# Patient Record
Sex: Female | Born: 1995 | Race: Black or African American | Hispanic: No | Marital: Single
Health system: Southern US, Community
[De-identification: ages and names within clinical notes are randomized; demographics above are authoritative.]

## PROBLEM LIST (undated history)

## (undated) DIAGNOSIS — J45909 Unspecified asthma, uncomplicated: Secondary | ICD-10-CM

---

## 2016-10-27 ENCOUNTER — Emergency Department
Admission: EM | Admit: 2016-10-27 | Discharge: 2016-10-27 | Disposition: A | Discharge status: Home / Self Care | Payer: Managed Care, Other (non HMO) | Attending: Emergency Medicine | Admitting: Emergency Medicine

## 2016-10-27 ENCOUNTER — Emergency Department: Payer: Managed Care, Other (non HMO)

## 2016-10-27 DIAGNOSIS — R1031 Right lower quadrant pain: Secondary | ICD-10-CM | POA: Insufficient documentation

## 2016-10-27 DIAGNOSIS — R109 Unspecified abdominal pain: Secondary | ICD-10-CM

## 2016-10-27 DIAGNOSIS — R112 Nausea with vomiting, unspecified: Secondary | ICD-10-CM | POA: Insufficient documentation

## 2016-10-27 DIAGNOSIS — R102 Pelvic and perineal pain: Secondary | ICD-10-CM

## 2016-10-27 DIAGNOSIS — R197 Diarrhea, unspecified: Secondary | ICD-10-CM | POA: Insufficient documentation

## 2016-10-27 HISTORY — DX: Unspecified asthma, uncomplicated: J45.909

## 2016-10-27 LAB — CBC WITH DIFFERENTIAL/PLATELET
Basophils Absolute: 0 10*3/uL (ref 0–0.1)
Basophils Relative: 0 %
EOS ABS: 0 10*3/uL (ref 0–0.7)
Eosinophils Relative: 0 %
HEMATOCRIT: 41.8 % (ref 35.0–47.0)
HEMOGLOBIN: 14.3 g/dL (ref 12.0–16.0)
LYMPHS ABS: 1.5 10*3/uL (ref 1.0–3.6)
Lymphocytes Relative: 9 %
MCH: 30 pg (ref 26.0–34.0)
MCHC: 34.3 g/dL (ref 32.0–36.0)
MCV: 87.6 fL (ref 80.0–100.0)
MONO ABS: 0.8 10*3/uL (ref 0.2–0.9)
MONOS PCT: 5 %
NEUTROS ABS: 14 10*3/uL — AB (ref 1.4–6.5)
NEUTROS PCT: 86 %
Platelets: 343 10*3/uL (ref 150–440)
RBC: 4.77 MIL/uL (ref 3.80–5.20)
RDW: 13.6 % (ref 11.5–14.5)
WBC: 16.4 10*3/uL — ABNORMAL HIGH (ref 3.6–11.0)

## 2016-10-27 LAB — COMPREHENSIVE METABOLIC PANEL
ALBUMIN: 4.8 g/dL (ref 3.5–5.0)
ALK PHOS: 49 U/L (ref 38–126)
ALT: 16 U/L (ref 14–54)
AST: 21 U/L (ref 15–41)
Anion gap: 12 (ref 5–15)
BUN: 10 mg/dL (ref 6–20)
CHLORIDE: 105 mmol/L (ref 101–111)
CO2: 23 mmol/L (ref 22–32)
Calcium: 9.5 mg/dL (ref 8.9–10.3)
Creatinine, Ser: 0.64 mg/dL (ref 0.44–1.00)
GFR calc non Af Amer: >60 mL/min (ref >60)
GLUCOSE: 92 mg/dL (ref 65–99)
Potassium: 3.7 mmol/L (ref 3.5–5.1)
SODIUM: 140 mmol/L (ref 135–145)
Total Bilirubin: 0.6 mg/dL (ref 0.3–1.2)
Total Protein: 8.3 g/dL — ABNORMAL HIGH (ref 6.5–8.1)

## 2016-10-27 LAB — URINALYSIS, COMPLETE (UACMP) WITH MICROSCOPIC
BACTERIA UA: NONE SEEN
Bilirubin Urine: NEGATIVE
Glucose, UA: NEGATIVE mg/dL
Ketones, ur: 80 mg/dL — AB
Leukocytes, UA: NEGATIVE
Nitrite: NEGATIVE
Protein, ur: 30 mg/dL — AB
Specific Gravity, Urine: 1.025 (ref 1.005–1.030)
pH: 5 (ref 5.0–8.0)

## 2016-10-27 LAB — LIPASE, BLOOD: LIPASE: 18 U/L (ref 11–51)

## 2016-10-27 LAB — WET PREP, GENITAL
Clue Cells Wet Prep HPF POC: NONE SEEN
Sperm: NONE SEEN
Trich, Wet Prep: NONE SEEN
Yeast Wet Prep HPF POC: NONE SEEN

## 2016-10-27 LAB — CHLAMYDIA/NGC RT PCR (ARMC ONLY)
CHLAMYDIA TR: DETECTED — AB
N gonorrhoeae: NOT DETECTED

## 2016-10-27 LAB — POCT PREGNANCY, URINE: PREG TEST UR: NEGATIVE

## 2016-10-27 MED ORDER — MORPHINE SULFATE (PF) 4 MG/ML IV SOLN
4.0000 mg | Freq: Once | INTRAVENOUS | Status: AC
Start: 2016-10-27 — End: 2016-10-27
  Administered 2016-10-27: 4 mg via INTRAVENOUS
  Filled 2016-10-27: qty 1

## 2016-10-27 MED ORDER — IOPAMIDOL (ISOVUE-300) INJECTION 61%
30.0000 mL | Freq: Once | INTRAVENOUS | Status: AC
  Administered 2016-10-27: 30 mL via ORAL

## 2016-10-27 MED ORDER — ONDANSETRON HCL 4 MG/2ML IJ SOLN
4.0000 mg | Freq: Once | INTRAMUSCULAR | Status: AC
  Administered 2016-10-27: 4 mg via INTRAVENOUS
  Filled 2016-10-27: qty 2

## 2016-10-27 MED ORDER — IOPAMIDOL (ISOVUE-300) INJECTION 61%
75.0000 mL | Freq: Once | INTRAVENOUS | Status: AC | PRN
  Administered 2016-10-27: 75 mL via INTRAVENOUS

## 2016-10-27 MED ORDER — SODIUM CHLORIDE 0.9 % IV BOLUS (SEPSIS)
1000.0000 mL | Freq: Once | INTRAVENOUS | Status: AC
  Administered 2016-10-27: 1000 mL via INTRAVENOUS

## 2016-10-27 MED ORDER — ONDANSETRON HCL 4 MG PO TABS: 4.0000 mg | ORAL_TABLET | Freq: Three times a day (TID) | ORAL | 0 refills | Status: AC | PRN

## 2016-10-27 NOTE — ED Provider Notes (Addendum)
[facility] Provider [facility]  ____________________________________________   I have reviewed the triage vital signs and the nursing notes.   HISTORY  Chief Complaint Abdominal Pain and Emesis    HPI Caitlin Mitchell is a 21 y.o. female Who is healthy, recently from [place], no prior abdominal surgeries, denies sexual activity, not pertinent perour findings here, presents today with nausea and vomiting for one week with diarrhea as well, watery stool, no melena or bright red blood per rectum. Did have trace of blood in her vomit at one point. Patient is also complaining of focal right lower quadrant abdominal pain. It's been there for a few days. Gradually got worse. Low-grade fever. No history of appendicitis or surgery in the past. No history of ovarian cysts. Denies sexual activity or vaginal discharge. pain is focal and sharp, gradual in onset, associated vomiting and diarrhea nothing makes it better nothing makes it worse. No radiation.   No past medical history on file.  There are no active problems to display for this patient.   No past surgical history on file.  Prior to Admission medications   Not on File    Allergies Patient has no known allergies.  No family history on file.  Social History Social History  Substance Use Topics  . Smoking status: Not on file  . Smokeless tobacco: Not on file  . Alcohol use Not on file    Review of Systems Constitutional: Subjective low-grade fever/ Eyes: No visual changes. ENT: No sore throat. No stiff neck no neck pain Cardiovascular: Denies chest pain. Respiratory: Denies shortness of breath. Gastrointestinal:   see history of present illness Genitourinary: Negative for dysuria. Musculoskeletal: Negative lower extremity swelling Skin: Negative for rash. Neurological: Negative for severe headaches, focal weakness or  numbness.   ____________________________________________   PHYSICAL EXAM:  VITAL SIGNS: ED Triage Vitals [10/27/16 0907]  Enc Vitals Group     BP 131/83     Pulse Rate 77     Resp 14     Temp 98.5 F (36.9 C)     Temp Source Oral     SpO2 96 %     Weight 85 lb (38.6 kg)     Height  (1.575 m)     Head Circumference      Peak Flow      Pain Score 4     Pain Loc      Pain Edu?      Excl. in GC?     Constitutional: Alert and oriented. Well appearing and in no acute distress. Eyes: Conjunctivae are normal Head: Atraumatic HEENT: No congestion/rhinnorhea. Mucous membranes are moist.  Oropharynx non-erythematous Neck:   Nontender with no meningismus, no masses, no stridor Cardiovascular: Normal rate, regular rhythm. Grossly normal heart sounds.  Good peripheral circulation. Respiratory: Normal respiratory effort.  No retractions. Lungs CTAB. Abdominal: Soft and positive tenderness to palpation in the right lower quadrant. No distention. voluntaryguarding no reboundnegative Rovsing sign Back:  There is no focal tenderness or step off.  there is no midline tenderness there are no lesions noted. there is no CVA tenderness Musculoskeletal: No lower extremity tenderness, no upper extremity tenderness. No joint effusions, no DVT signs strong distal pulses no edema Neurologic:  Normal speech and language. No gross focal neurologic deficits are appreciated.  Skin:  Skin is warm, dry and intact. No rash noted. Psychiatric: Mood and affect are normal. Speech and behavior are normal.  ____________________________________________   LABS (  all labs ordered are listed, but only abnormal results are displayed)  Labs Reviewed  COMPREHENSIVE METABOLIC PANEL  CBC WITH DIFFERENTIAL/PLATELET  URINALYSIS, COMPLETE (UACMP) WITH MICROSCOPIC  LIPASE, BLOOD  POC URINE PREG, ED  POCT PREGNANCY, URINE    Pertinent labs  results that were available during my care of the patient were reviewed  by me and considered in my medical decision making (see chart for details). ____________________________________________  EKG  I personally interpreted any EKGs ordered by me or triage  ____________________________________________  RADIOLOGY  Pertinent labs & imaging results that were available during my care of the patient were reviewed by me and considered in my medical decision making (see chart for details). If possible, patient and/or family made aware of any abnormal findings. ____________________________________________    PROCEDURES  Procedure(s) performed: None  Procedures  Critical Care performed: None  ____________________________________________   INITIAL IMPRESSION / ASSESSMENT AND PLAN / ED COURSE  Pertinent labs & imaging results that were available during my care of the patient were reviewed by me and considered in my medical decision making (see chart for details).  patient here with right lower quadrant pain, vomiting and diarrhea for a week, abdominal pain gradually worsening. She is tender but nonsurgical at this time. Does not have an acute abdomen in other words. Differential includes PID EtOH ectopic pregnancy, ovarian cyst. Given the prevalence of GI symptoms including diarrhea, as well as no vaginal symptoms and patient's denies sexual activity, no vaginal discharge, low suspicion for TOA or ovarian cyst but certainly not impossible. Patient will be given pain medicationsantiemetics, IV fluid, and I'll obtain blood work and a CT scan to rule out appendicitis. If that is negative, obviously a patient further workup might be indicated at this time she would prefer to defer pelvic exam  Clinical Course as of Oct 28 1454  Sun Oct 27, 2016  1041 Pt feeling much better after pain meds ivf and zofran. Awaiting, ct. Tol contrast well thus far.   [JM]  1419 patient has never had a pelvic exam before, she found the x-rays to be in control. She is currently on her  menses. Female nurse chaperone was present patient was aware that she did not have to have the procedure. For but she does consent to it. She tolerated it very well. The patient had, however, some anxiety about the procedure itself and it made it very difficult to tell if there is other pathology present. Certainly no significant vaginal discharge, does not appear to be consistent with cervical motion tenderness, and she had some diffuse tenderness which is minimaland difficult to determine the focality of. We will sent for ultrasound for further evaluation. There is mild vaginal bleeding which is consistent with her menstrual period.  [JM]    Clinical Course User Index [JM] Jeanmarie Plant, MD    ----------------------------------------- 2:56 PM on 10/27/2016 -----------------------------------------  she feels much better, CT scan and pelvic exam blood work pelvic exam and an comprehensive and exhaustive workup in the emergency department very reassuring. Patient in no acute distress abdomen completely benign at this time, she feels better after fluids. She is here for vomiting and diarrhea. She denied lower abdominal pain. There is no obvious source. Again nothing strongly suggestive of PID. Return precautions and follow-up given and understood. Patient and family feel very comfortable with this plan. ____________________________________________   FINAL CLINICAL IMPRESSION(S) / ED DIAGNOSES  Final diagnoses:  None      This chart was dictated using  voice recognition software.  Despite best efforts to proofread,  errors can occur which can change meaning.      Jeanmarie Plant, MD 10/27/16 1610    Jeanmarie Plant, MD 10/27/16 [id number]

## 2016-10-27 NOTE — ED Triage Notes (Signed)
Pt c/o intermittent RLQ pain and vomiting for last week worse today.  Ambulatory. No fevers. No diarrhea.  Saw blood in vomit today per pt.  VSS. NAD.

## 2016-10-28 ENCOUNTER — Telehealth: Payer: Self-pay | Admitting: Emergency Medicine

## 2016-10-28 NOTE — Telephone Encounter (Signed)
Called to inform of positive chlamydia test.  Per dr Pershing Proud can call in azithromycin 1 gram po for patient.  I explained results, need for partner treatment, free std clinic available to patient.

## 2018-09-20 IMAGING — US US PELVIS COMPLETE TRANSABD/TRANSVAG
1 series · 14 of 25 positions shown · non-contrast
Comparison: CT 10/27/2016

CLINICAL DATA: Pelvic pain.  RIGHT lower quadrant pain for 1 week



[Series 1: us pelvis complete transabd/transvag · 0.18mm/px · 14 of 69 slices shown]
[im 1/69]
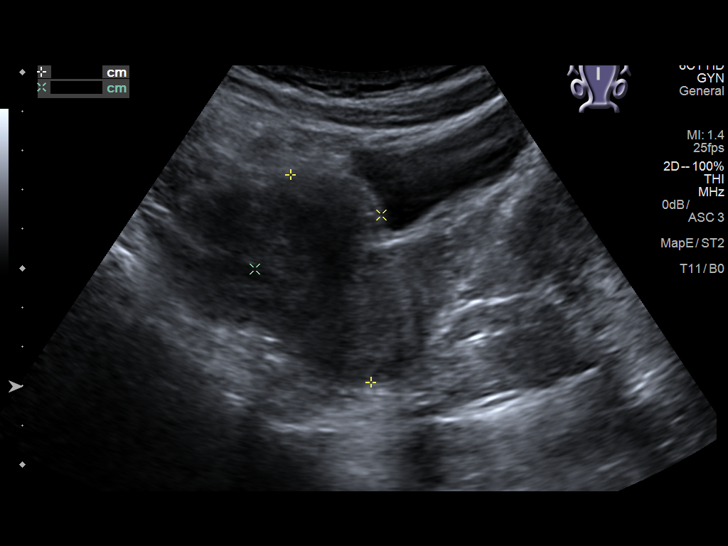
[im 6/69]
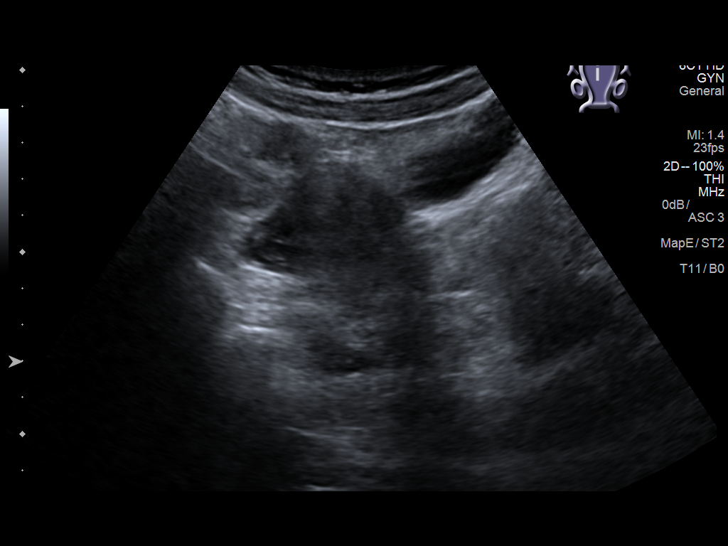
[im 12/69]
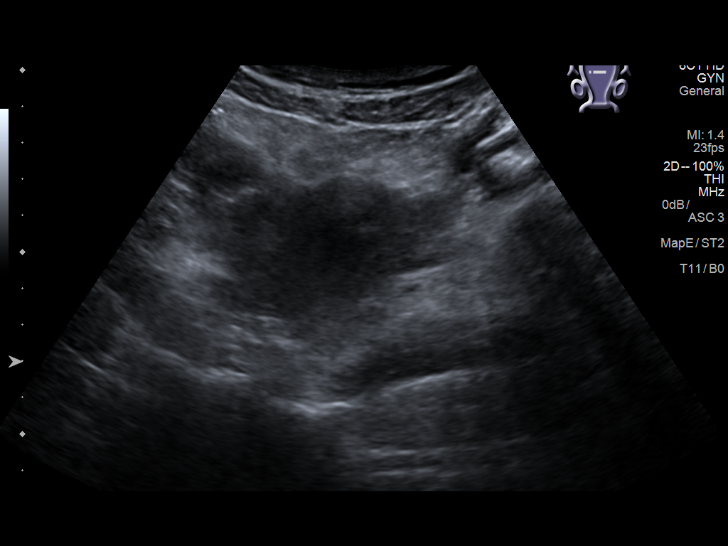
[im 18/69]
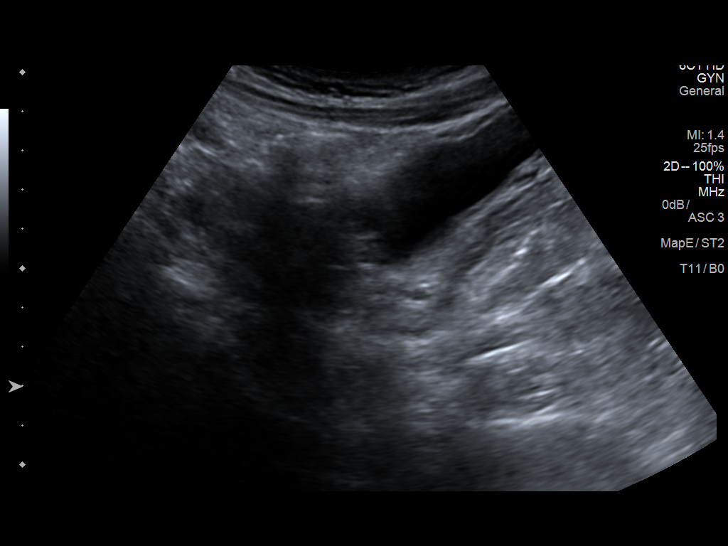
[im 23/69]
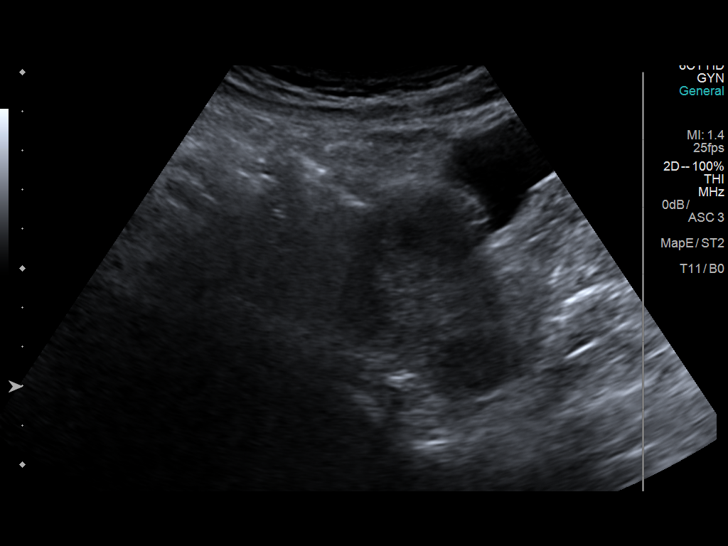
[im 26/69]
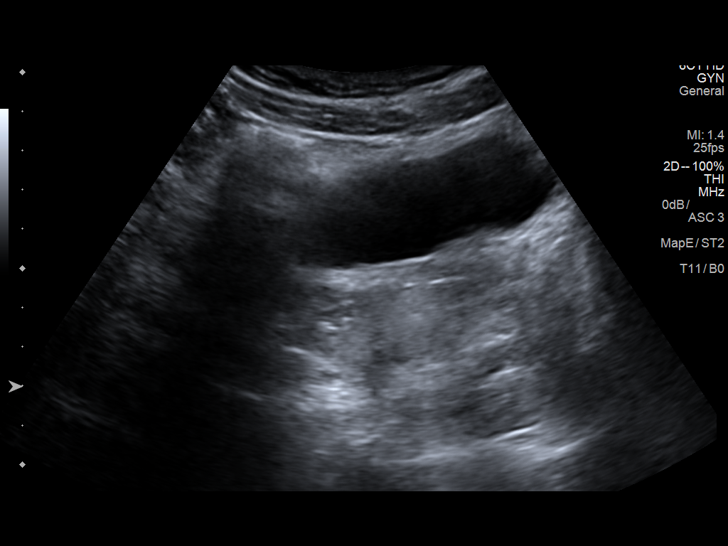
[im 32/69]
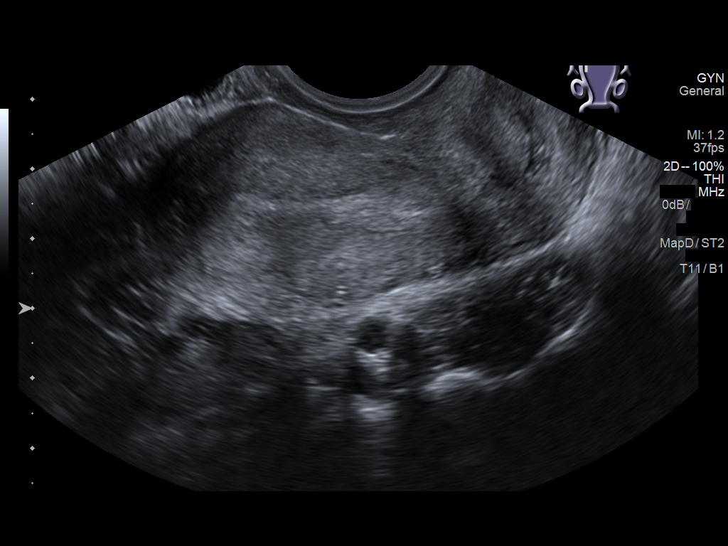
[im 37/69]
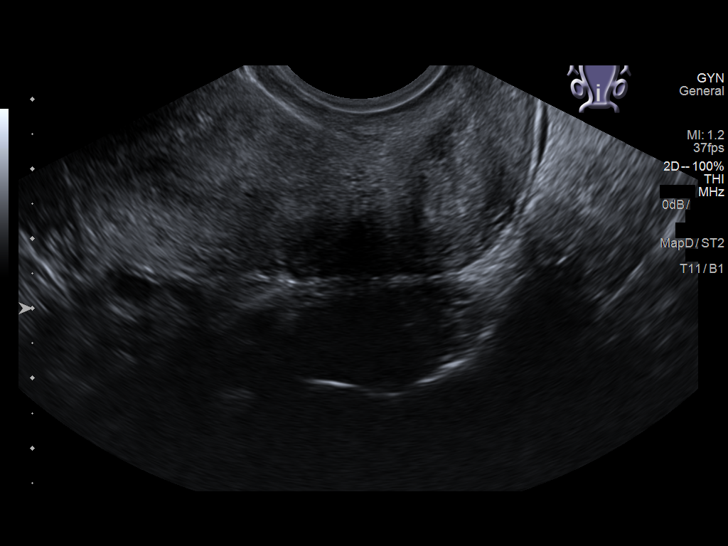
[im 43/69]
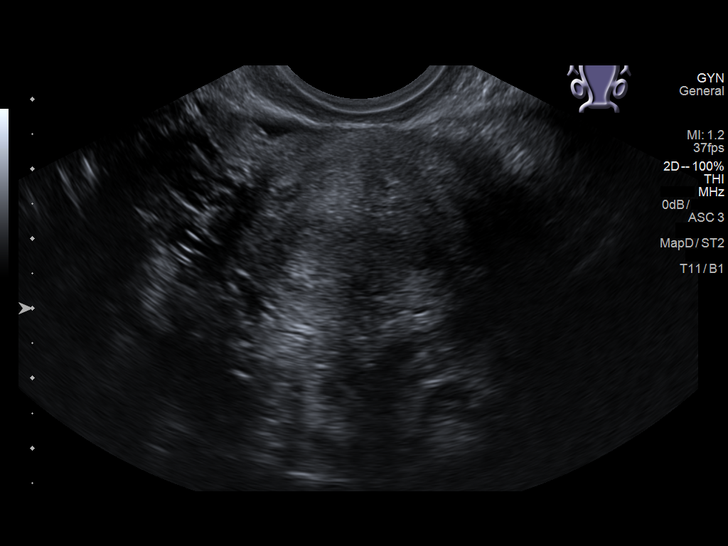
[im 46/69]
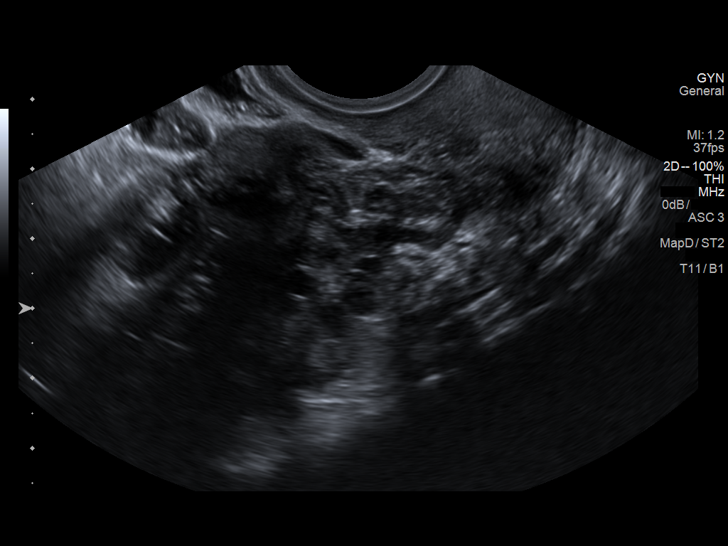
[im 52/69]
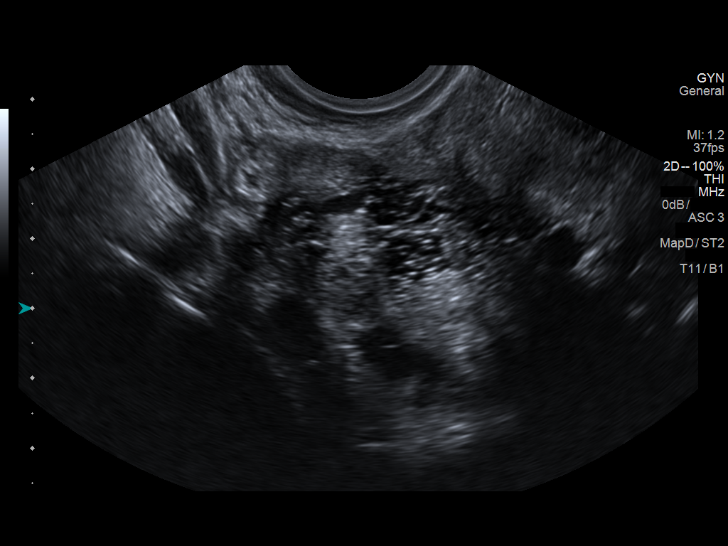
[im 57/69]
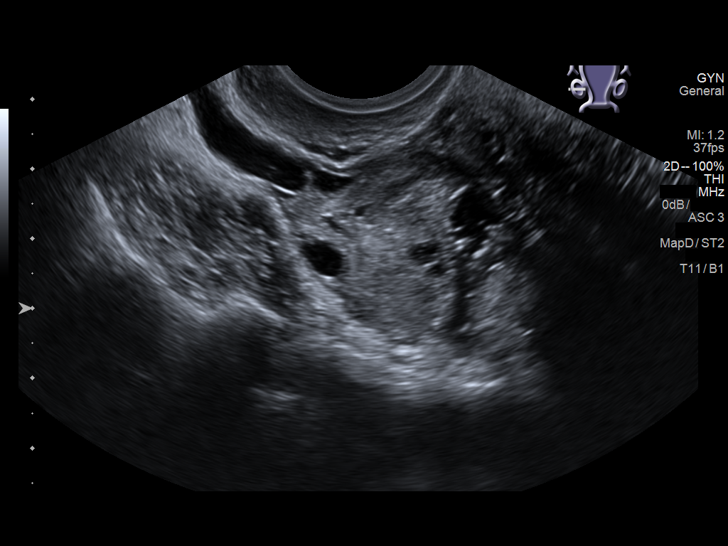
[im 63/69]
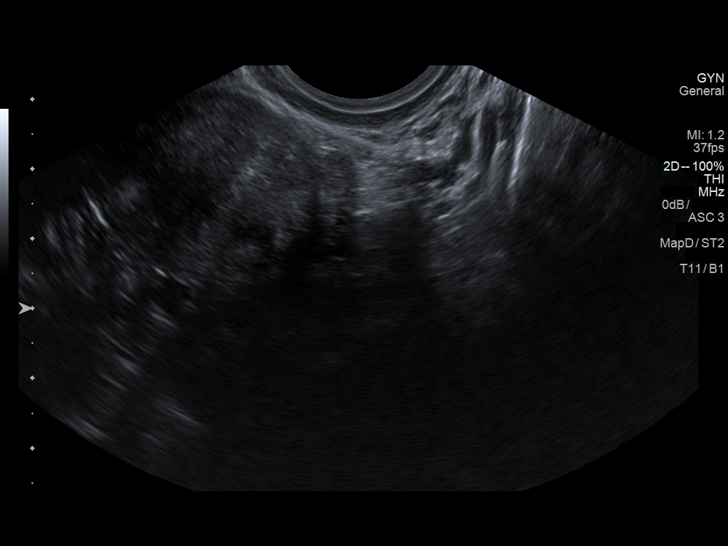
[im 69/69]
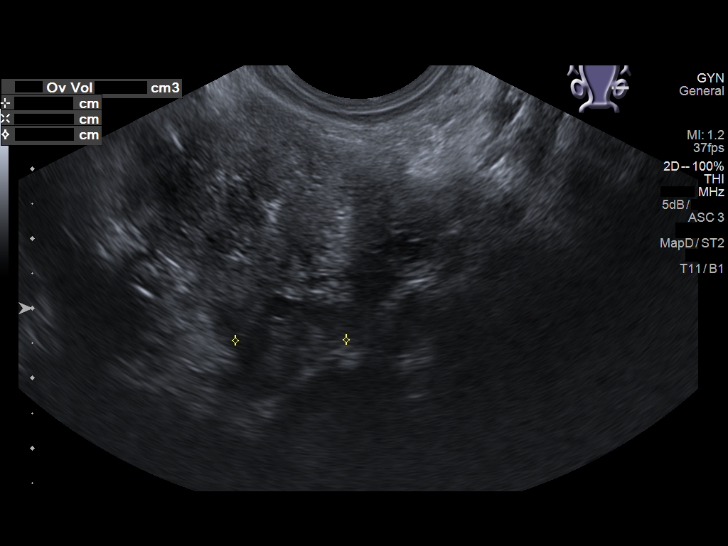

[14 of 25 positions shown; findings below may reference images not displayed]

FINDINGS: Uterus

Measurements: Normal in size at 6.1 x 3.0 x 3.6 cm. No fibroids or
other mass visualized.

Endometrium

Thickness: Normal thickness at 4.3 mm. No focal abnormality
visualized.

Right ovary

Measurements: Normal in size with small follicles measuring 2.9 x
2.3 x 2.2 cm.

Left ovary

Measurements: Normal size small follicles measuring 2.5 x 1.3 x
cm..

Other findings

Trace physiologic free fluid
IMPRESSION: 1. Normal pelvic ultrasound.
2. Trace free fluid is physiologic

## 2019-05-04 IMAGING — CT CT ABD-PELV W/ CM
2 of 4 series · 16 of 46 positions shown, 18 images · IV contrast (APPLIED)
Comparison: None.

CLINICAL DATA: RIGHT lower quadrant pain and vomiting for 1 week.

EXAM:
CT ABDOMEN AND PELVIS WITH CONTRAST
TECHNIQUE: Multidetector CT imaging of the abdomen and pelvis was performed
using the standard protocol following bolus administration of
intravenous contrast.
CONTRAST:  75mL [id number] IOPAMIDOL ([id number]) INJECTION 61%

[Series 2: routine abd/pel with · axial · 0.58mm/px · z∈[-842,-482]mm · 13 of 80 slices shown, 15 images]
[im 4/80  soft-tissue]
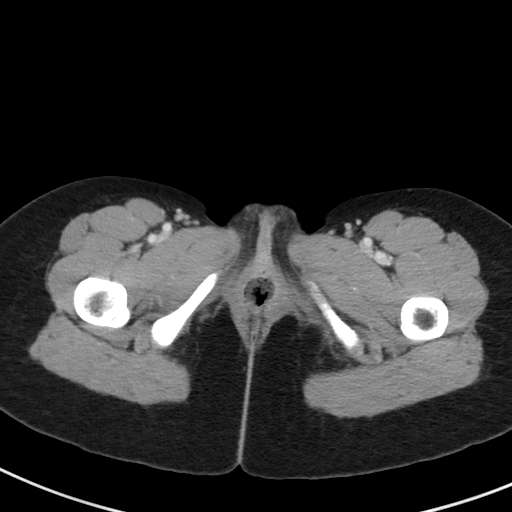
[im 4/80  bone]
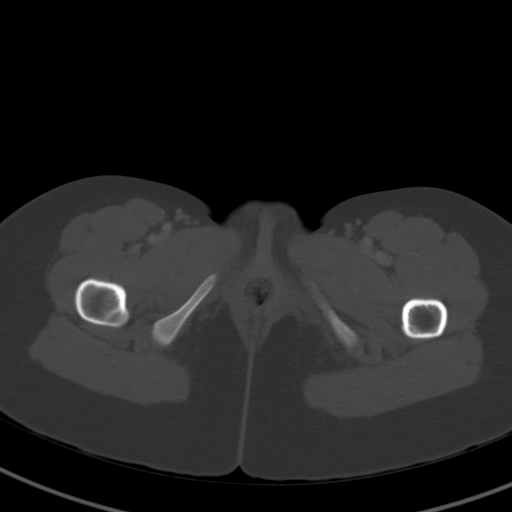
[im 10/80  soft-tissue]
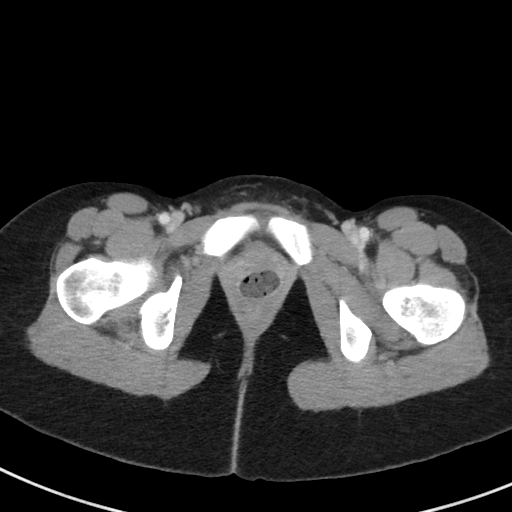
[im 17/80  soft-tissue]
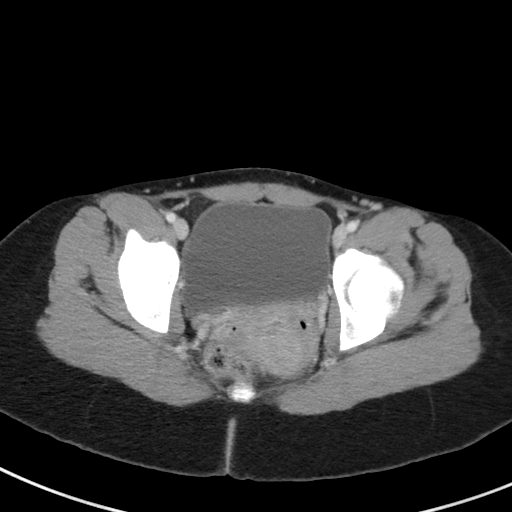
[im 24/80  soft-tissue]
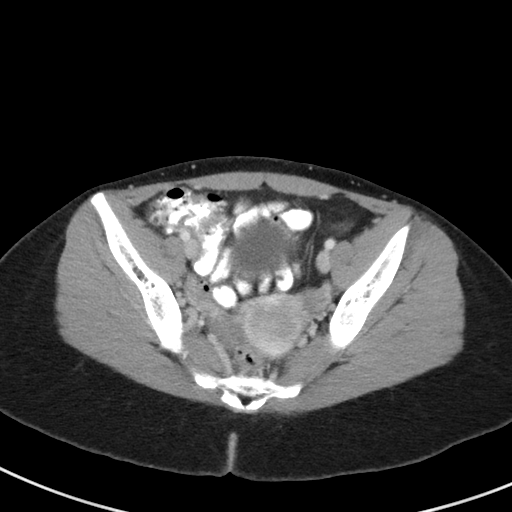
[im 27/80  soft-tissue]
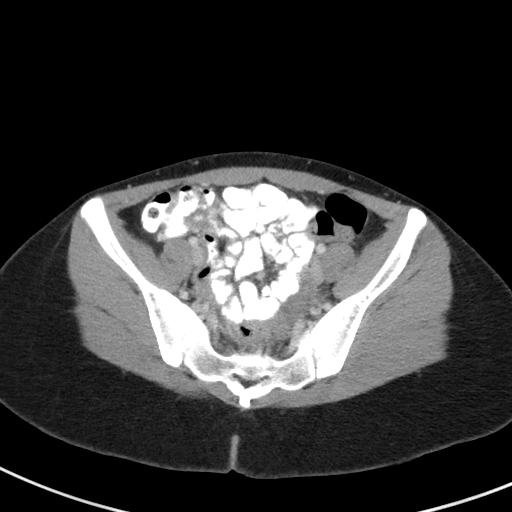
[im 33/80  soft-tissue]
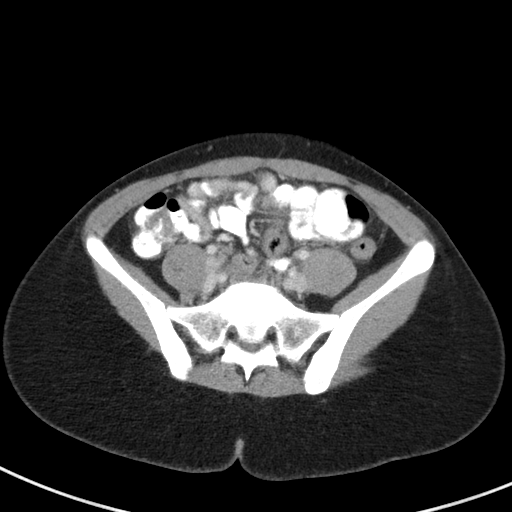
[im 40/80  soft-tissue]
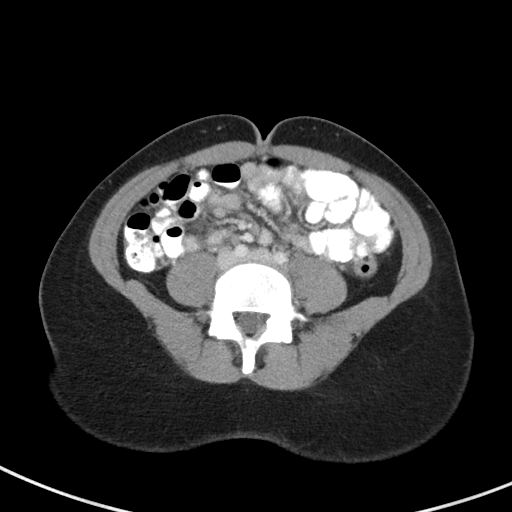
[im 47/80  soft-tissue]
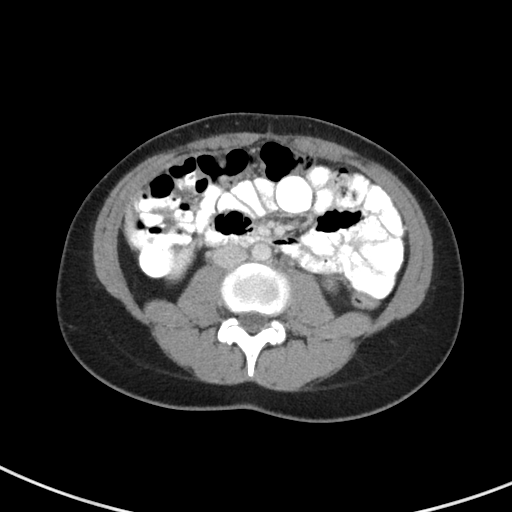
[im 53/80  soft-tissue]
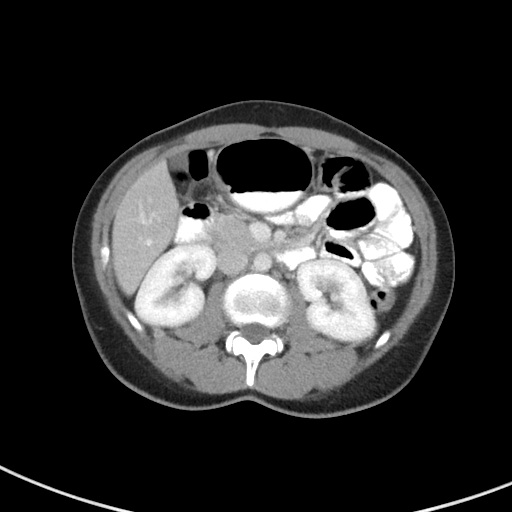
[im 53/80  bone]
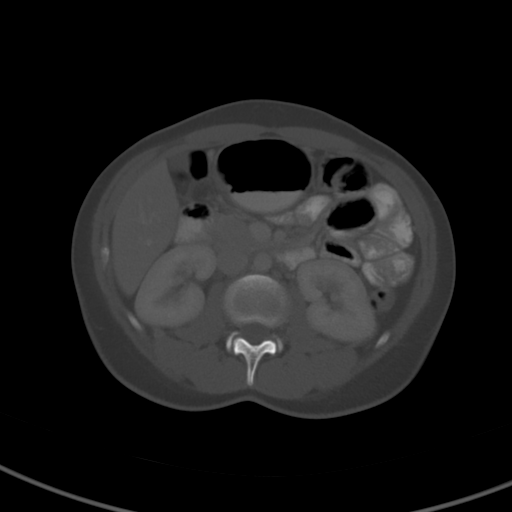
[im 56/80  soft-tissue]
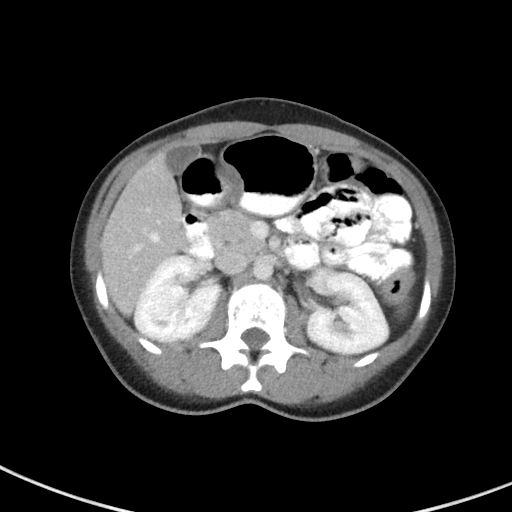
[im 63/80  soft-tissue]
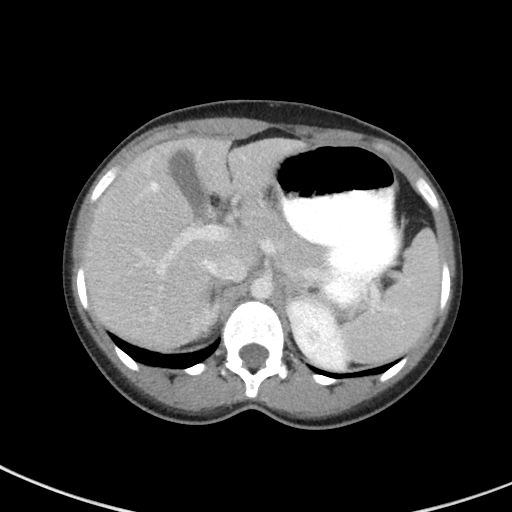
[im 70/80  soft-tissue]
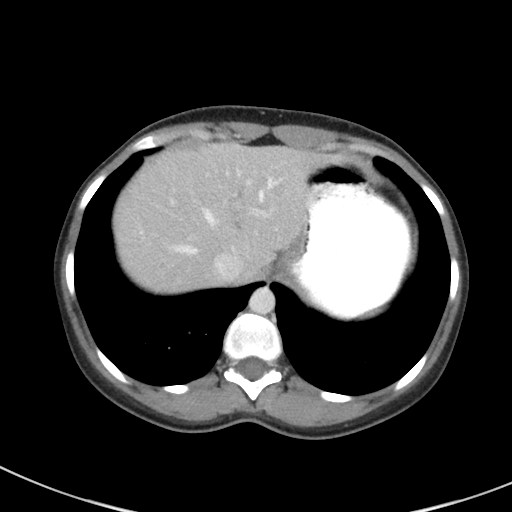
[im 76/80  soft-tissue]
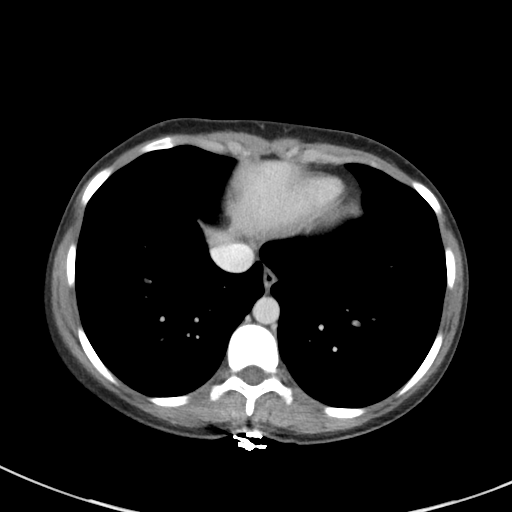

[Series 5: coronal st · coronal · 0.55mm/px · 3 of 68 slices shown]
[im 23/68  soft-tissue]
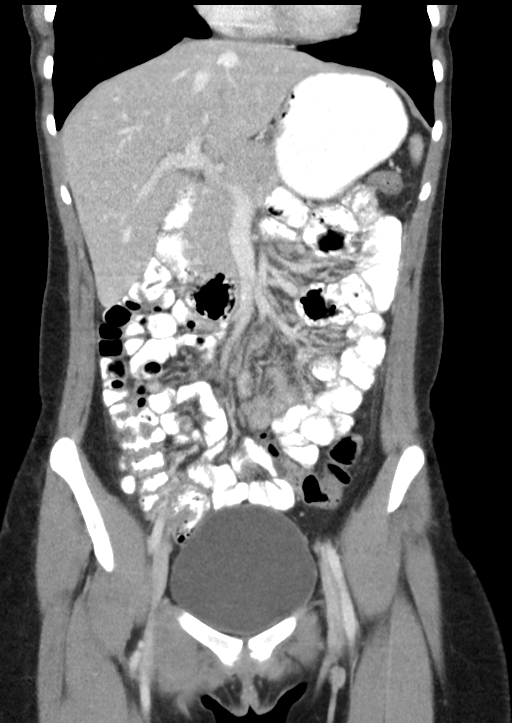
[im 30/68  soft-tissue]
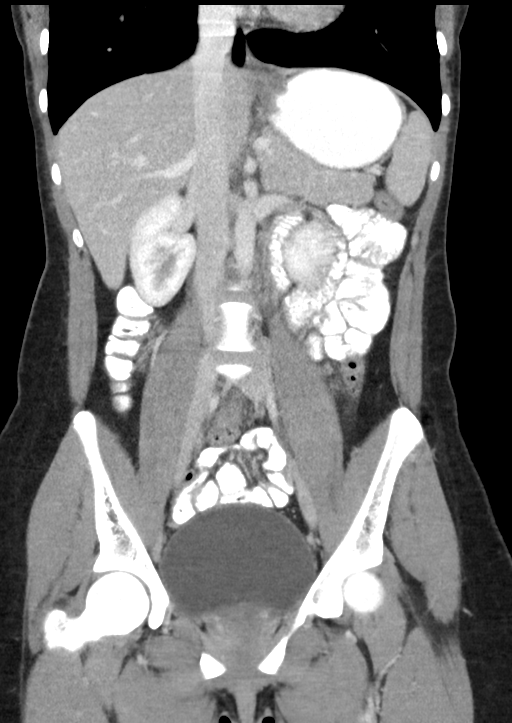
[im 38/68  soft-tissue]
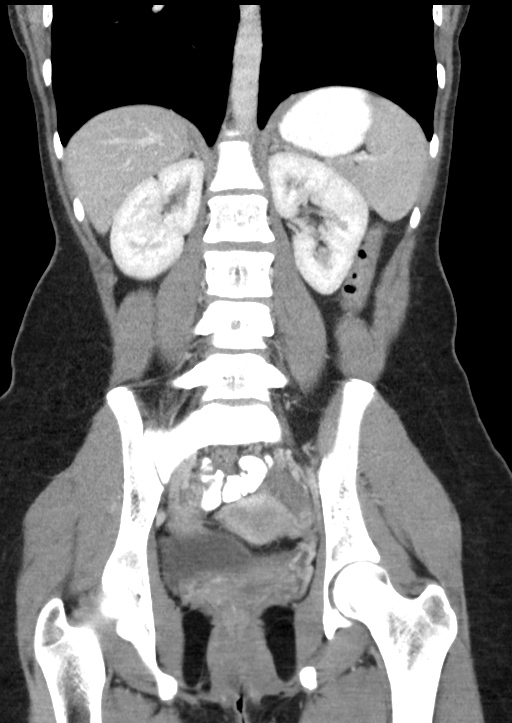

[16 of 46 positions shown; findings below may reference images not displayed]

FINDINGS: Lower chest: Lung bases are clear.

Hepatobiliary: No focal hepatic lesion. No biliary duct dilatation.
Gallbladder is normal. Common bile duct is normal.

Pancreas: Pancreas is normal. No ductal dilatation. No pancreatic
inflammation.

Spleen: Normal spleen

Adrenals/urinary tract: Adrenal glands and kidneys are normal. The
ureters and bladder normal.

Stomach/Bowel: Stomach, small bowel, appendix, and cecum are normal.
The colon and rectosigmoid colon are normal.

Vascular/Lymphatic: Abdominal aorta is normal caliber. There is no
retroperitoneal or periportal lymphadenopathy. No pelvic
lymphadenopathy.

Reproductive: Uterus and ovaries normal.  Tampon in the vagina

Other: No free fluid.

Musculoskeletal: No aggressive osseous lesion.
IMPRESSION: 1. No acute abdominopelvic findings.
2. Normal appendix
